# Patient Record
Sex: Male | Born: 1965 | Race: White | Hispanic: No | Marital: Married | Smoking: Never smoker
Health system: Southern US, Community
[De-identification: ages and names within clinical notes are randomized; demographics above are authoritative.]

---

## 2012-03-26 ENCOUNTER — Emergency Department: Payer: Self-pay | Admitting: Emergency Medicine

## 2012-03-26 LAB — CBC
HCT: 46.1 % (ref 40.0–52.0)
HGB: 16.5 g/dL (ref 13.0–18.0)
MCH: 30.8 pg (ref 26.0–34.0)
MCV: 86 fL (ref 80–100)
Platelet: 229 10*3/uL (ref 150–440)
RBC: 5.37 10*6/uL (ref 4.40–5.90)
WBC: 9.5 10*3/uL (ref 3.8–10.6)

## 2012-03-26 LAB — BASIC METABOLIC PANEL
Calcium, Total: 8.4 mg/dL — ABNORMAL LOW (ref 8.5–10.1)
Chloride: 110 mmol/L — ABNORMAL HIGH (ref 98–107)
Co2: 22 mmol/L (ref 21–32)
Creatinine: 0.8 mg/dL (ref 0.60–1.30)
EGFR (African American): >60
Potassium: 3.9 mmol/L (ref 3.5–5.1)
Sodium: 142 mmol/L (ref 136–145)

## 2012-03-26 LAB — TROPONIN I: Troponin-I: <0.02 ng/mL

## 2013-05-05 ENCOUNTER — Ambulatory Visit: Payer: Self-pay | Admitting: Neurology

## 2014-11-10 ENCOUNTER — Ambulatory Visit: Payer: Medicaid Other | Attending: Specialist

## 2014-11-10 DIAGNOSIS — G4733 Obstructive sleep apnea (adult) (pediatric): Secondary | ICD-10-CM | POA: Diagnosis not present

## 2014-11-10 DIAGNOSIS — R5381 Other malaise: Secondary | ICD-10-CM | POA: Diagnosis present

## 2015-08-30 ENCOUNTER — Encounter: Payer: Self-pay | Admitting: *Deleted

## 2015-08-30 ENCOUNTER — Emergency Department: Payer: Medicaid Other

## 2015-08-30 DIAGNOSIS — M79641 Pain in right hand: Secondary | ICD-10-CM | POA: Diagnosis present

## 2015-08-30 DIAGNOSIS — L03113 Cellulitis of right upper limb: Secondary | ICD-10-CM | POA: Insufficient documentation

## 2015-08-30 NOTE — ED Notes (Signed)
Pt has pain and swelling in right hand and arm.  Pt was picking a pin to close cow trailer.  Pt states it feels like a charlie horse in my hand.  No chest pain or sob.

## 2015-08-31 ENCOUNTER — Emergency Department
Admission: EM | Admit: 2015-08-31 | Discharge: 2015-08-31 | Disposition: A | Discharge status: Home / Self Care | Payer: Medicaid Other | Attending: Emergency Medicine | Admitting: Emergency Medicine

## 2015-08-31 DIAGNOSIS — L03113 Cellulitis of right upper limb: Secondary | ICD-10-CM

## 2015-08-31 DIAGNOSIS — R609 Edema, unspecified: Secondary | ICD-10-CM

## 2015-08-31 MED ORDER — SULFAMETHOXAZOLE-TRIMETHOPRIM 800-160 MG PO TABS
1.0000 | ORAL_TABLET | Freq: Once | ORAL | Status: AC
  Administered 2015-08-31: 1 via ORAL
  Filled 2015-08-31: qty 1

## 2015-08-31 MED ORDER — SULFAMETHOXAZOLE-TRIMETHOPRIM 800-160 MG PO TABS: 1.0000 | ORAL_TABLET | Freq: Two times a day (BID) | ORAL | Status: AC

## 2015-08-31 MED ORDER — OXYCODONE-ACETAMINOPHEN 5-325 MG PO TABS: 1.0000 | ORAL_TABLET | ORAL | Status: AC | PRN

## 2015-08-31 MED ORDER — OXYCODONE-ACETAMINOPHEN 5-325 MG PO TABS
1.0000 | ORAL_TABLET | Freq: Once | ORAL | Status: AC
  Administered 2015-08-31: 1 via ORAL
  Filled 2015-08-31: qty 1

## 2015-08-31 NOTE — ED Provider Notes (Signed)
[facility] Emergency Department Provider Note  ____________________________________________  Time seen: 2:30 AM  I have reviewed the triage vital signs and the nursing notes.   HISTORY  Chief Complaint Hand Pain      HPI Ryan Solis is a 50 y.o. male presents with acute onset of right hand pain swelling and redness     No past medical history on file.  There are no active problems to display for this patient.    Past surgical history none  Current Outpatient Rx  Name  Route  Sig  Dispense  Refill  . oxyCODONE-acetaminophen (PERCOCET/ROXICET) 5-325 MG tablet   Oral   Take 1 tablet by mouth every 4 (four) hours as needed for severe pain.   20 tablet   0   . sulfamethoxazole-trimethoprim (BACTRIM DS,SEPTRA DS) 800-160 MG tablet   Oral   Take 1 tablet by mouth 2 (two) times daily.   20 tablet   0     Allergies No known drug allergies No family history on file.  Social History Social History  Substance Use Topics  . Smoking status: Never Smoker   . Smokeless tobacco: Not on file  . Alcohol Use: No    Review of Systems  Constitutional: Negative for fever. Eyes: Negative for visual changes. ENT: Negative for sore throat. Cardiovascular: Negative for chest pain. Respiratory: Negative for shortness of breath. Gastrointestinal: Negative for abdominal pain, vomiting and diarrhea. Genitourinary: Negative for dysuria. Musculoskeletal: Negative for back pain. Positive for right hand pain and swelling Skin: Negative for rash. Neurological: Negative for headaches, focal weakness or numbness.   10-point ROS otherwise negative.  ____________________________________________   PHYSICAL EXAM:  VITAL SIGNS: ED Triage Vitals  Enc Vitals Group     BP 08/30/15 2242 148/92 mmHg     Pulse Rate 08/30/15 2242 83     Resp 08/30/15 2242 18     Temp 08/30/15 2242 98 F (36.7 C)     Temp Source 08/30/15 2242 Oral     SpO2 08/30/15  2242 96 %     Weight 08/30/15 2242 285 lb (129.275 kg)     Height 08/30/15 2242 6' (1.829 m)     Head Cir --      Peak Flow --      Pain Score 08/30/15 2250 8     Pain Loc --      Pain Edu? --      Excl. in GC? --      Constitutional: Alert and oriented. Well appearing and in no distress. Eyes: Conjunctivae are normal. PERRL. Normal extraocular movements. ENT   Head: Normocephalic and atraumatic.   Nose: No congestion/rhinnorhea.   Mouth/Throat: Mucous membranes are moist.   Neck: No stridor. Hematological/Lymphatic/Immunilogical: No cervical lymphadenopathy. Cardiovascular: Normal rate, regular rhythm. Normal and symmetric distal pulses are present in all extremities. No murmurs, rubs, or gallops. Respiratory: Normal respiratory effort without tachypnea nor retractions. Breath sounds are clear and equal bilaterally. No wheezes/rales/rhonchi. Gastrointestinal: Soft and nontender. No distention. There is no CVA tenderness. Genitourinary: deferred Musculoskeletal: Pain in active range of motion of the right hand No joint effusions.  No lower extremity tenderness nor edema. Neurologic:  Normal speech and language. No gross focal neurologic deficits are appreciated. Speech is normal.  Skin: Blanching erythema noted to the dorsal aspect of the right hand, warm to touch Psychiatric: Mood and affect are normal. Speech and behavior are normal. Patient exhibits appropriate insight and judgment.  RADIOLOGY  US Venous Img Upper Uni Right (Final result) Result time: 08/31/15 00:20:36   Final result by Rad Results In Interface (08/31/15 00:20:36)   Narrative:   CLINICAL DATA: 50 year old male is with right hand swelling  EXAM: Right UPPER EXTREMITY VENOUS DOPPLER ULTRASOUND  TECHNIQUE: Gray-scale sonography with graded compression, as well as color Doppler and duplex ultrasound were performed to evaluate the upper extremity deep venous system from the level of the  subclavian vein and including the jugular, axillary, basilic, radial, ulnar and upper cephalic vein. Spectral Doppler was utilized to evaluate flow at rest and with distal augmentation maneuvers.  COMPARISON: None.  FINDINGS: Contralateral Subclavian Vein: Respiratory phasicity is normal and symmetric with the symptomatic side. No evidence of thrombus. Normal compressibility.  Internal Jugular Vein: No evidence of thrombus. Normal compressibility, respiratory phasicity and response to augmentation.  Subclavian Vein: No evidence of thrombus. Normal compressibility, respiratory phasicity and response to augmentation.  Axillary Vein: No evidence of thrombus. Normal compressibility, respiratory phasicity and response to augmentation.  Cephalic Vein: No evidence of thrombus. Normal compressibility, respiratory phasicity and response to augmentation.  Basilic Vein: No evidence of thrombus. Normal compressibility, respiratory phasicity and response to augmentation.  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 No evidence of thrombus. Normal compressibility, respiratory phasicity and response to augmentation.  Radial Veins: No evidence of thrombus. Normal compressibility, respiratory phasicity and response to augmentation.  Ulnar Veins: No evidence of thrombus. Normal compressibility, respiratory phasicity and response to augmentation.  Venous Reflux: None visualized.  Other Findings: None visualized.  IMPRESSION: No evidence of deep venous thrombosis in the right upper extremity.   Electronically Signed By: Elgie Collard M.D. On: 08/31/2015 00:20          DG Hand Complete Right (Final result) Result time: 08/30/15 23:18:41   Final result by Rad Results In Interface (08/30/15 23:18:41)   Narrative:   CLINICAL DATA: 50 year old male with hand swelling and pain.  EXAM: RIGHT HAND - COMPLETE 3+ VIEW  COMPARISON:  None.  FINDINGS: There is no acute fracture or dislocation. A small cystic lesion noted in the proximal aspect of the middle phalanx of the third digit, likely a subchondral cyst. There is mild diffuse soft tissue swelling of the hand. No radiopaque foreign object identified.  IMPRESSION: No acute osseous pathology.  Mild diffuse soft tissue swelling.   Electronically Signed By: Elgie Collard M.D. On: 08/30/2015 23:18           INITIAL IMPRESSION / ASSESSMENT AND PLAN / ED COURSE  Pertinent labs & imaging results that were available during my care of the patient were reviewed by me and considered in my medical decision making (see chart for details). Patient received Bactrim in the emergency department will be prescribed same for home. I counseled patient at length regarding the necessity to return to the emergency department immediately if area of erythema were to worsen worsening pain or swelling. She is advised to follow-up with primary care provider for further evaluation   ____________________________________________   FINAL CLINICAL IMPRESSION(S) / ED DIAGNOSES  Final diagnoses:  Cellulitis of right hand      Darci Current, MD 09/02/15 2354

## 2015-08-31 NOTE — Discharge Instructions (Signed)

## 2017-10-04 IMAGING — CR DG HAND COMPLETE 3+V*R*
1 series · 3 of 3 positions shown · non-contrast
Comparison: None.

CLINICAL DATA: 49-year-old male with hand swelling and pain.

EXAM:
RIGHT HAND - COMPLETE 3+ VIEW

[Series 1: x hand pa right · 0.14mm/px · 3 of 3 slices shown]
[im 1/3]
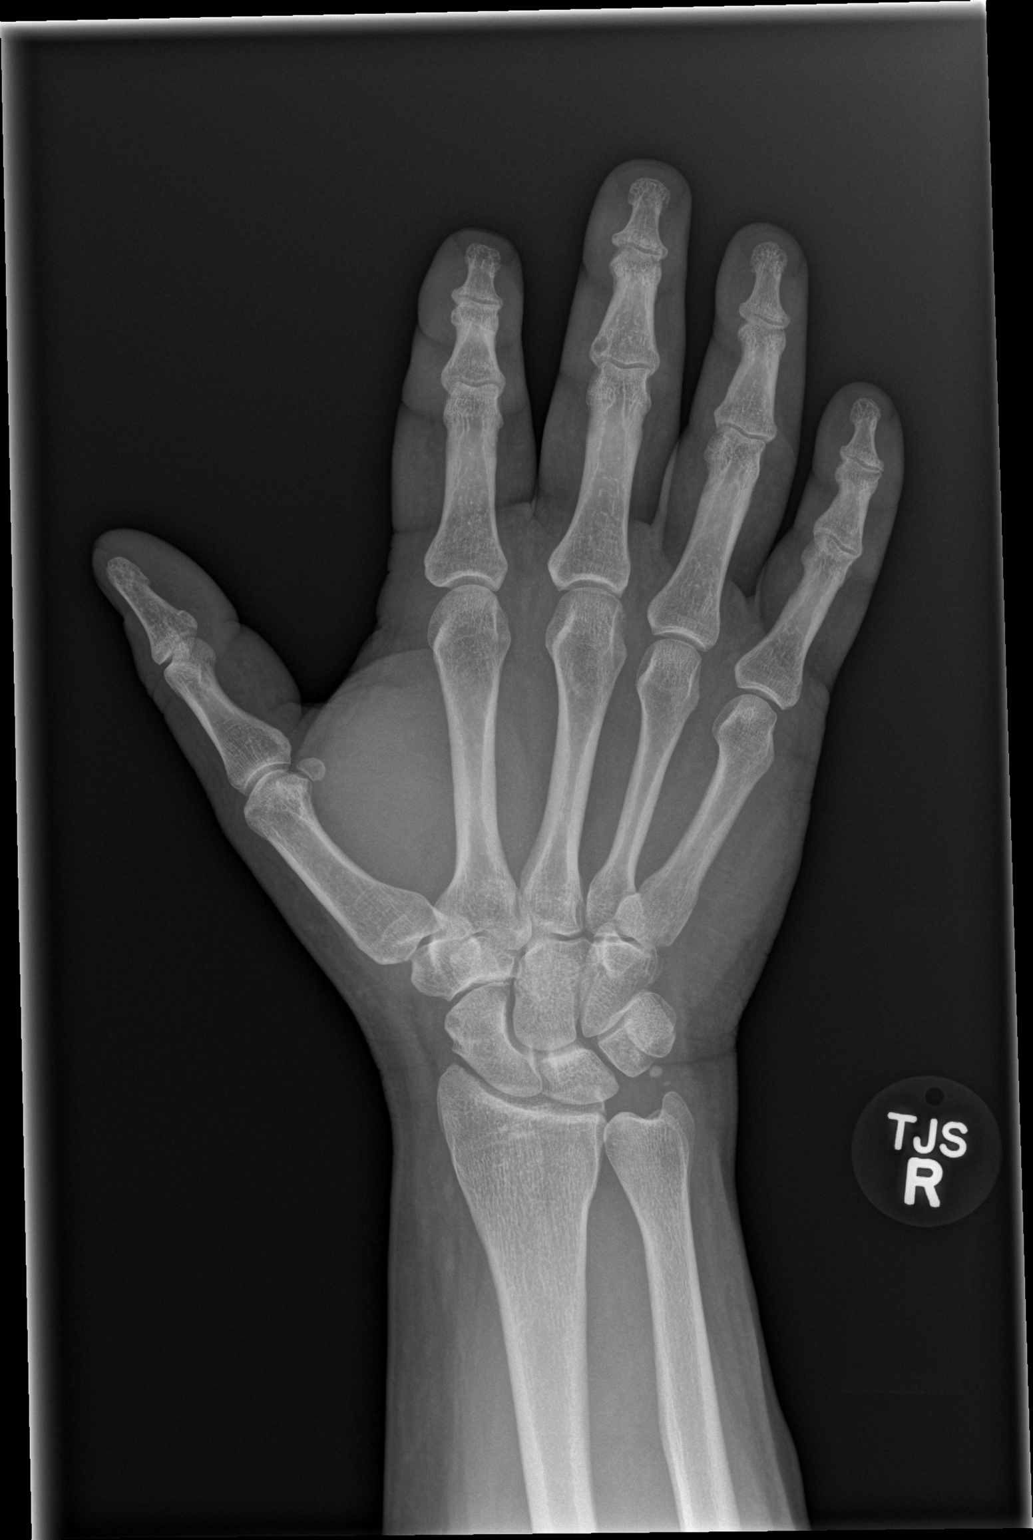
[im 2/3]
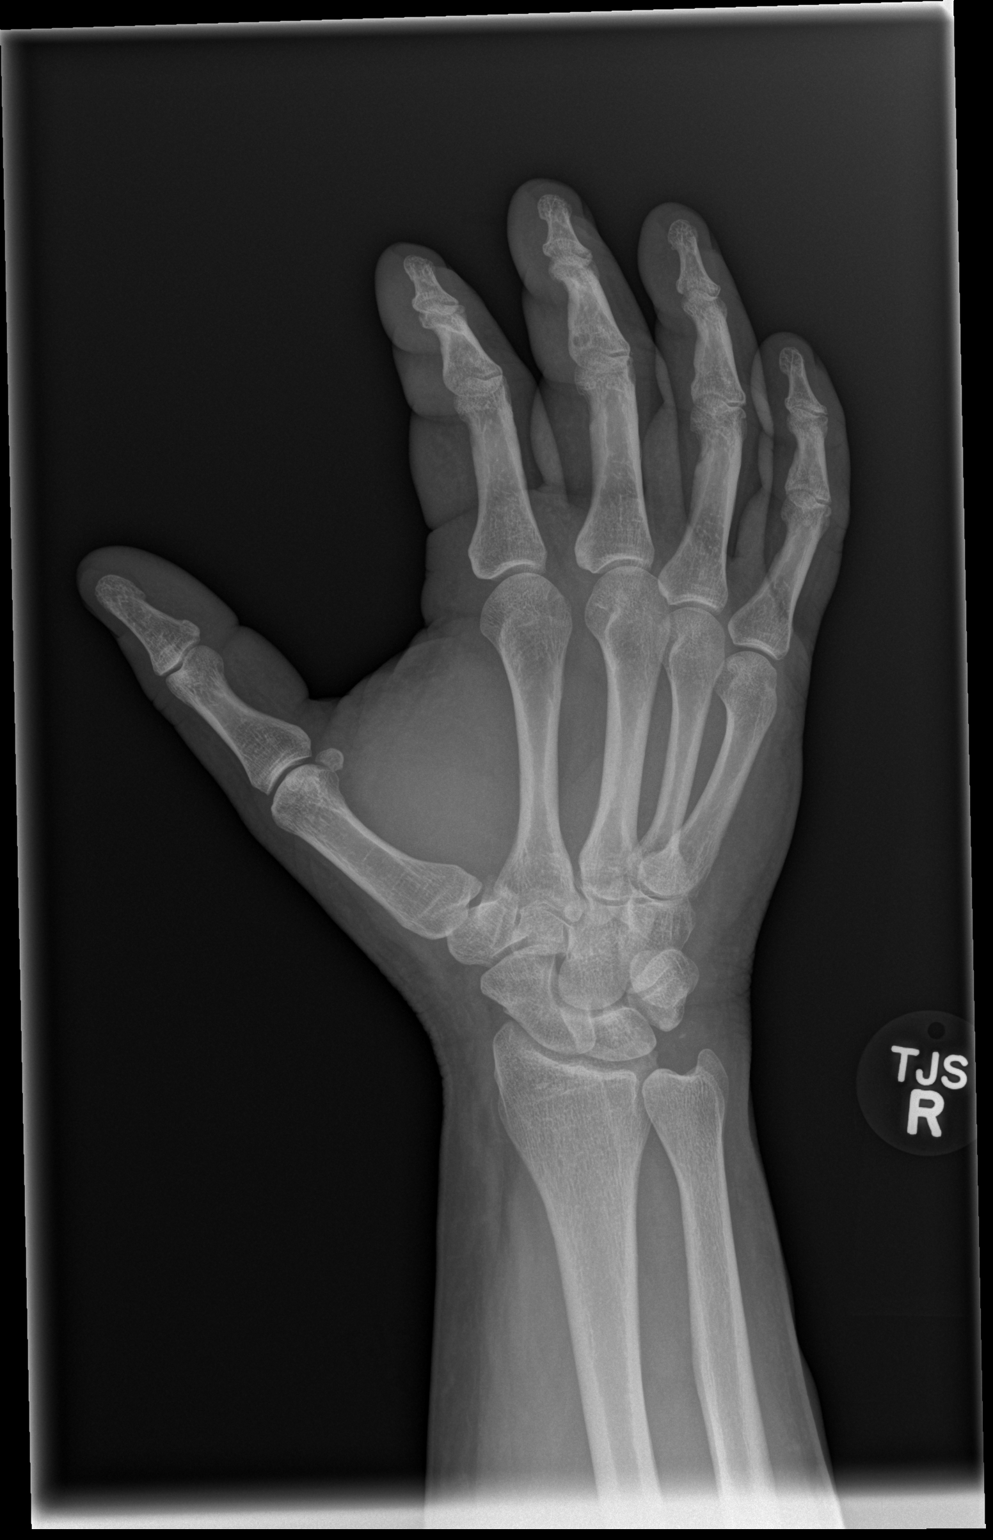
[im 3/3]
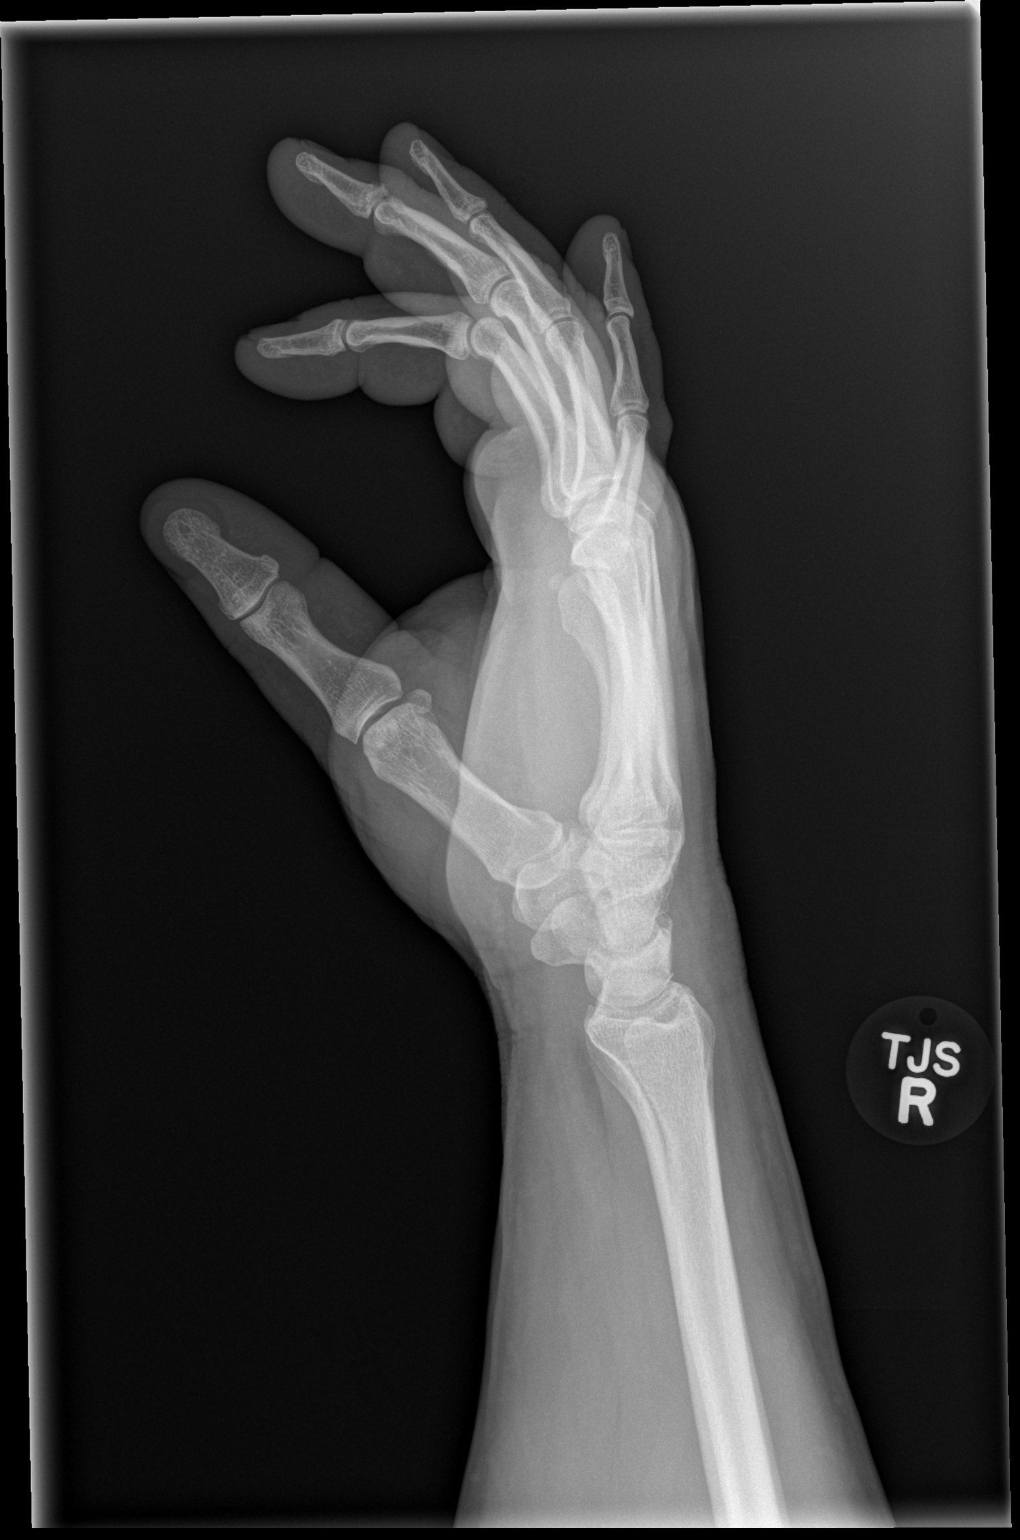

[3 of 3 positions shown; findings below may reference images not displayed]

FINDINGS: There is no acute fracture or dislocation. A small cystic lesion
noted in the proximal aspect of the middle phalanx of the third
digit, likely a subchondral cyst. There is mild diffuse soft tissue
swelling of the hand. No radiopaque foreign object identified.
IMPRESSION: No acute osseous pathology.

Mild diffuse soft tissue swelling.

## 2018-02-03 IMAGING — US US EXTREM  UP VENOUS*R*
1 series · 13 of 24 positions shown · non-contrast
Comparison: None.

CLINICAL DATA: 49-year-old male is with right hand swelling



[Series 1: us extrem up venous*right* · 0.08mm/px · 13 of 33 slices shown]
[im 1/33]
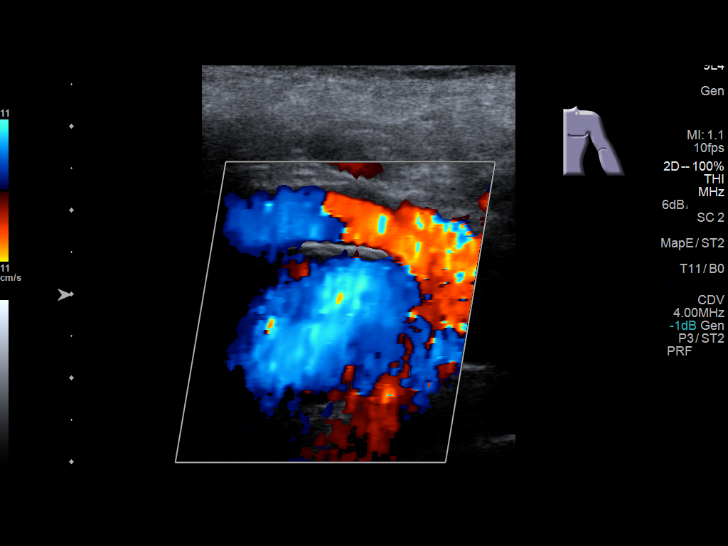
[im 3/33]
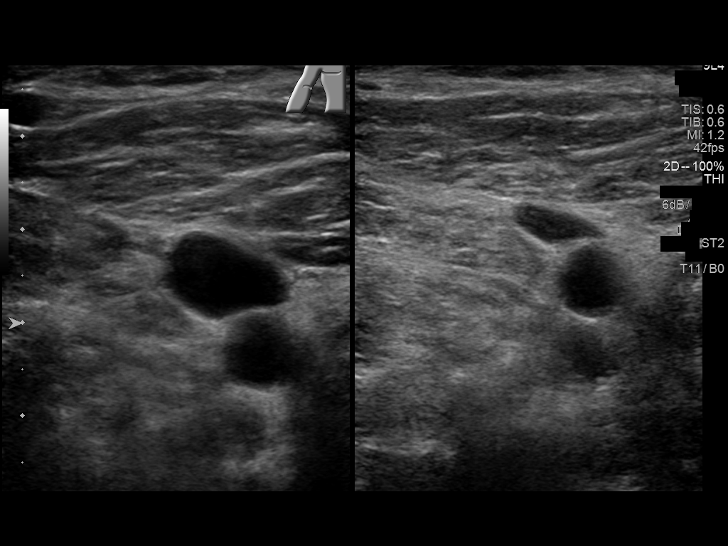
[im 6/33]
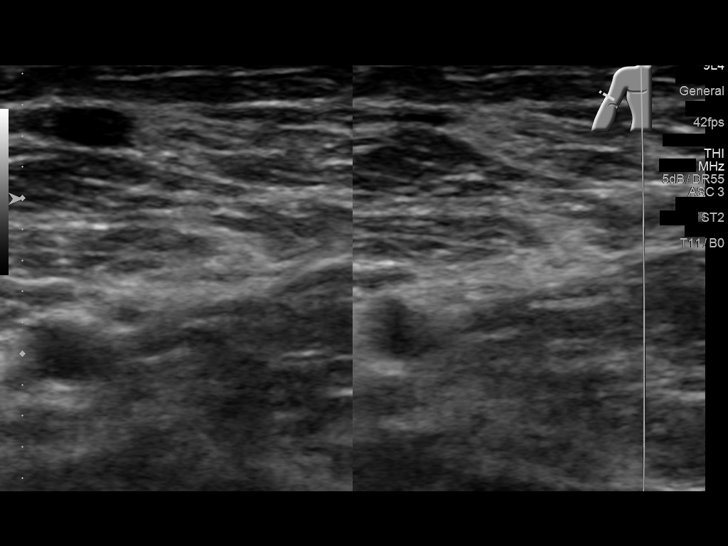
[im 9/33]
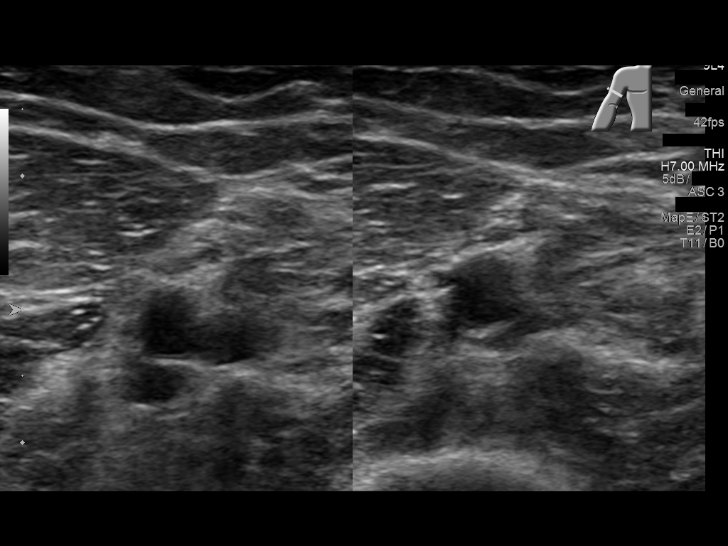
[im 12/33]
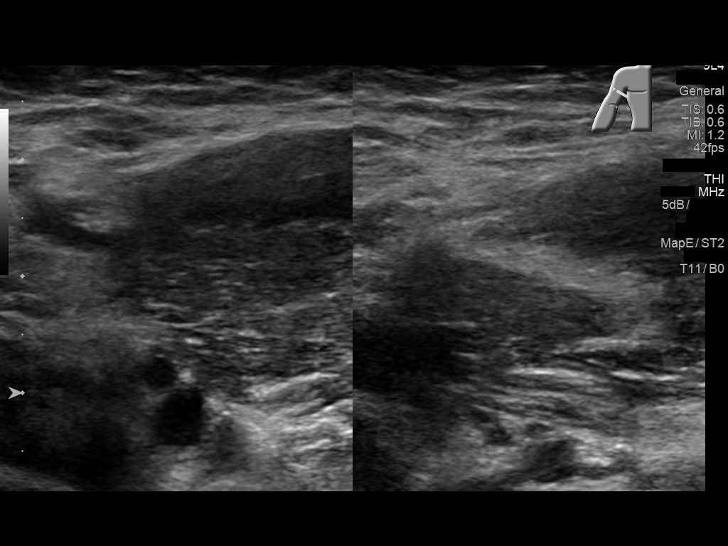
[im 14/33]
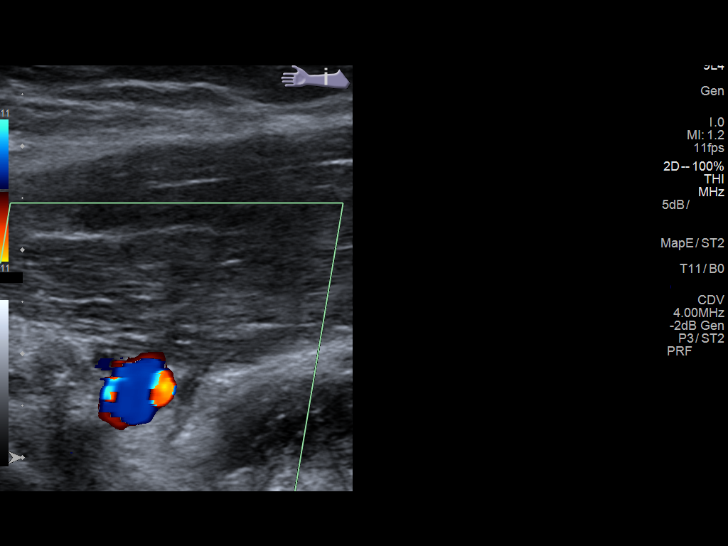
[im 17/33]
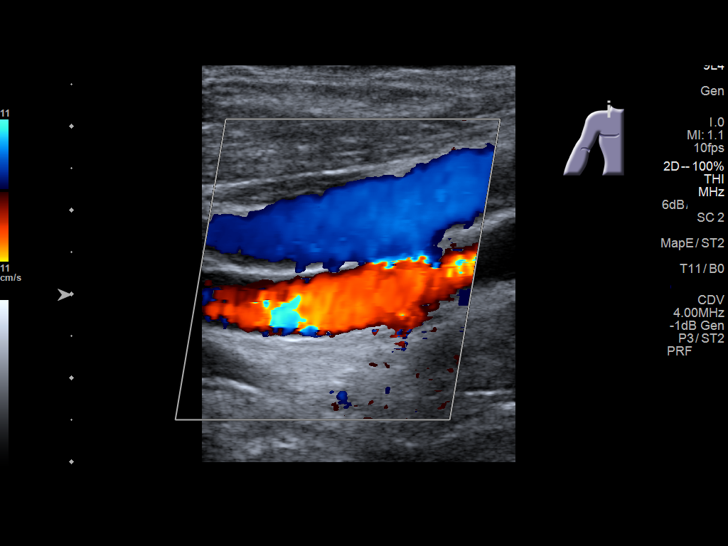
[im 19/33]
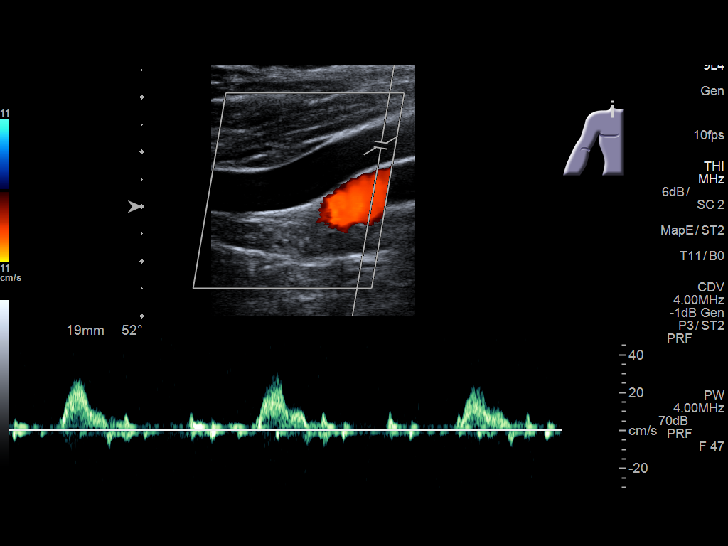
[im 21/33]
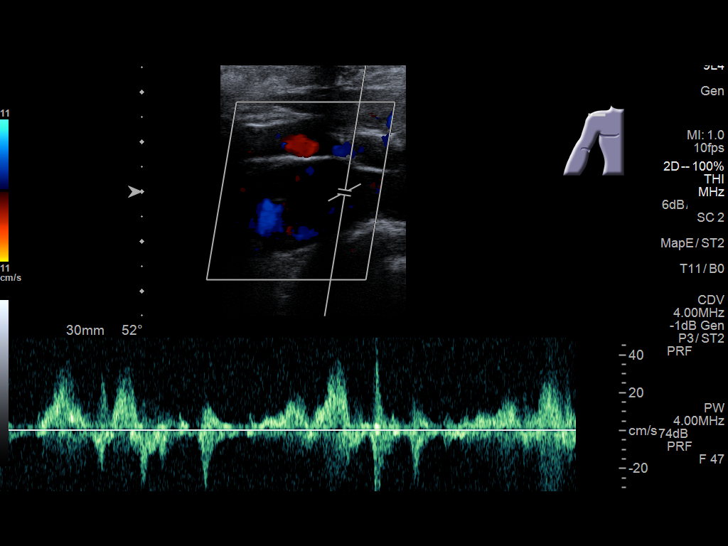
[im 24/33]
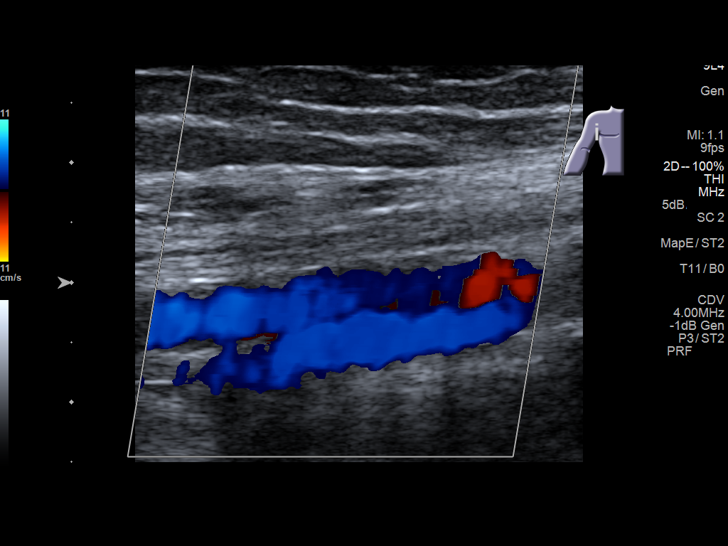
[im 27/33]
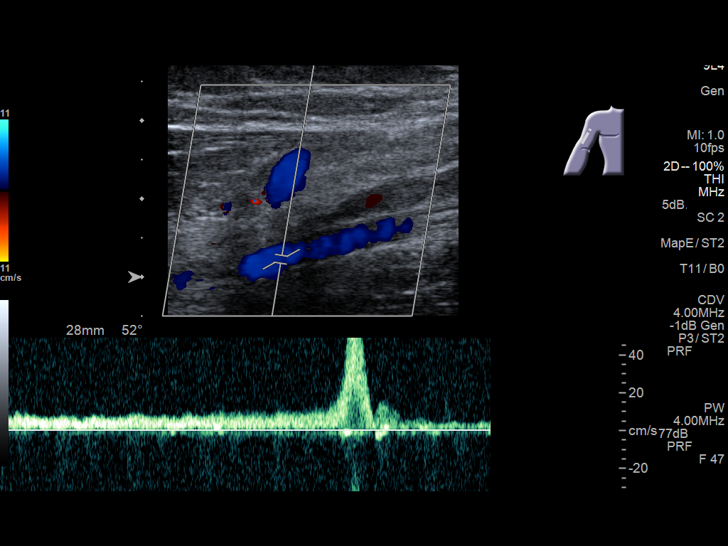
[im 30/33]
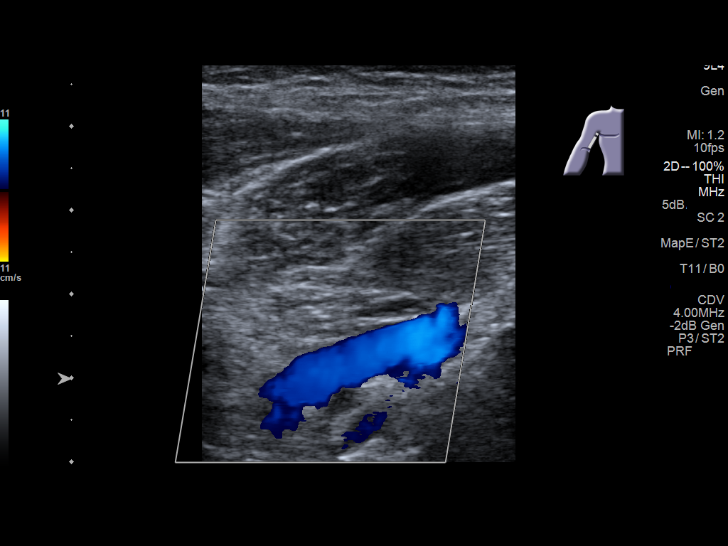
[im 33/33]
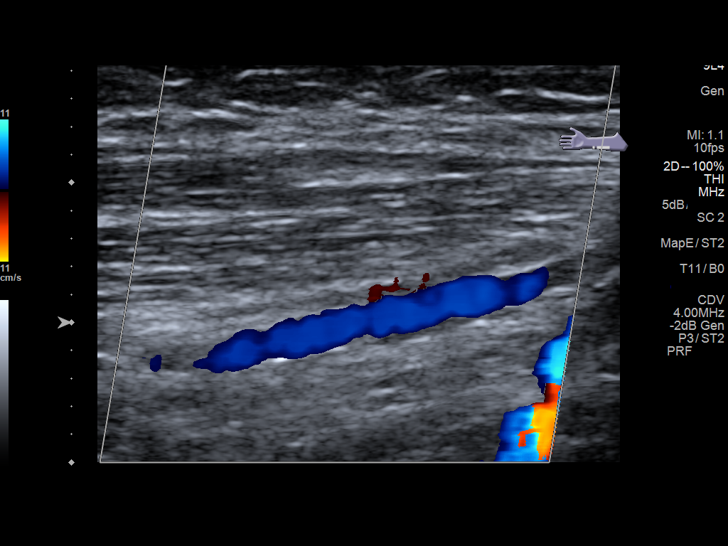

[13 of 24 positions shown; findings below may reference images not displayed]

FINDINGS: Contralateral Subclavian Vein: Respiratory phasicity is normal and
symmetric with the symptomatic side. No evidence of thrombus. Normal
compressibility.

Internal Jugular Vein: No evidence of thrombus. Normal
compressibility, respiratory phasicity and response to augmentation.

Subclavian Vein: No evidence of thrombus. Normal compressibility,
respiratory phasicity and response to augmentation.

Axillary Vein: No evidence of thrombus. Normal compressibility,
respiratory phasicity and response to augmentation.

Cephalic Vein: No evidence of thrombus. Normal compressibility,
respiratory phasicity and response to augmentation.

Basilic Vein: No evidence of thrombus. Normal compressibility,
respiratory phasicity and response to augmentation.

Brachial Veins: There is apparent duplication of the brachial vein.
No evidence of thrombus. Normal compressibility, respiratory
phasicity and response to augmentation.

Radial Veins: No evidence of thrombus. Normal compressibility,
respiratory phasicity and response to augmentation.

Ulnar Veins: No evidence of thrombus. Normal compressibility,
respiratory phasicity and response to augmentation.

Venous Reflux:  None visualized.

Other Findings:  None visualized.
IMPRESSION: No evidence of deep venous thrombosis in the right upper extremity.
